# Patient Record
Sex: Male | Born: 1955 | Race: White | Hispanic: No | Marital: Married | Smoking: Never smoker
Health system: Southern US, Community
[De-identification: ages and names within clinical notes are randomized; demographics above are authoritative.]

## PROBLEM LIST (undated history)

## (undated) DIAGNOSIS — I10 Essential (primary) hypertension: Secondary | ICD-10-CM

## (undated) DIAGNOSIS — C801 Malignant (primary) neoplasm, unspecified: Secondary | ICD-10-CM

## (undated) DIAGNOSIS — E119 Type 2 diabetes mellitus without complications: Secondary | ICD-10-CM

## (undated) DIAGNOSIS — Z8639 Personal history of other endocrine, nutritional and metabolic disease: Secondary | ICD-10-CM

## (undated) HISTORY — PX: LIVER TRANSPLANT: SHX410

---

## 1999-10-19 ENCOUNTER — Encounter (INDEPENDENT_AMBULATORY_CARE_PROVIDER_SITE_OTHER): Payer: Self-pay | Admitting: Specialist

## 1999-10-19 ENCOUNTER — Ambulatory Visit (HOSPITAL_COMMUNITY): Admission: RE | Admit: 1999-10-19 | Discharge: 1999-10-19 | Payer: Self-pay | Admitting: Gastroenterology

## 2007-01-30 ENCOUNTER — Ambulatory Visit: Payer: Self-pay | Admitting: Hematology and Oncology

## 2007-02-15 LAB — CBC WITH DIFFERENTIAL/PLATELET
BASO%: 0.9 % (ref 0.0–2.0)
Basophils Absolute: 0 10*3/uL (ref 0.0–0.1)
EOS%: 2.8 % (ref 0.0–7.0)
HGB: 10.2 g/dL — ABNORMAL LOW (ref 13.0–17.1)
LYMPH%: 18.2 % (ref 14.0–48.0)
MCHC: 32.9 g/dL (ref 32.0–35.9)
MONO%: 12.1 % (ref 0.0–13.0)
NEUT#: 1.9 10*3/uL (ref 1.5–6.5)
NEUT%: 66 % (ref 40.0–75.0)
WBC: 2.9 10*3/uL — ABNORMAL LOW (ref 4.0–10.0)

## 2007-02-15 LAB — URINALYSIS, MICROSCOPIC - CHCC
Glucose: NEGATIVE g/dL
Ketones: NEGATIVE mg/dL
Leukocyte Esterase: NEGATIVE
Nitrite: NEGATIVE

## 2007-02-19 LAB — COMPREHENSIVE METABOLIC PANEL
ALT: 27 U/L (ref 0–53)
AST: 30 U/L (ref 0–37)
BUN: 18 mg/dL (ref 6–23)
CO2: 22 mEq/L (ref 19–32)
Calcium: 9 mg/dL (ref 8.4–10.5)
Creatinine, Ser: 0.9 mg/dL (ref 0.40–1.50)
Potassium: 4 mEq/L (ref 3.5–5.3)
Sodium: 141 mEq/L (ref 135–145)
Total Bilirubin: 0.8 mg/dL (ref 0.3–1.2)
Total Protein: 6.9 g/dL (ref 6.0–8.3)

## 2007-02-19 LAB — PROTEIN ELECTROPHORESIS, SERUM
Alpha-1-Globulin: 3.8 % (ref 2.9–4.9)
Beta Globulin: 7.6 % — ABNORMAL HIGH (ref 4.7–7.2)
Total Protein, Serum Electrophoresis: 6.9 g/dL (ref 6.0–8.3)

## 2007-02-19 LAB — DIRECT ANTIGLOBULIN TEST (NOT AT ARMC): DAT IgG: NEGATIVE

## 2007-02-19 LAB — VITAMIN B12: Vitamin B-12: 610 pg/mL (ref 211–911)

## 2007-02-19 LAB — FERRITIN: Ferritin: 5 ng/mL — ABNORMAL LOW (ref 22–322)

## 2007-02-19 LAB — ERYTHROPOIETIN: Erythropoietin: 83.8 m[IU]/mL — ABNORMAL HIGH (ref 2.6–34.0)

## 2007-02-19 LAB — HEPATITIS C ANTIBODY: HCV Ab: POSITIVE — AB

## 2007-02-19 LAB — LACTATE DEHYDROGENASE: LDH: 169 U/L (ref 94–250)

## 2007-03-08 ENCOUNTER — Ambulatory Visit (HOSPITAL_COMMUNITY): Admission: RE | Admit: 2007-03-08 | Discharge: 2007-03-08 | Payer: Self-pay | Admitting: Hematology and Oncology

## 2007-03-08 ENCOUNTER — Other Ambulatory Visit: Admission: RE | Admit: 2007-03-08 | Discharge: 2007-03-08 | Payer: Self-pay | Admitting: Oncology

## 2007-03-08 ENCOUNTER — Encounter: Payer: Self-pay | Admitting: Hematology and Oncology

## 2007-03-14 LAB — HEPATITIS B CORE ANTIBODY, IGM: Hep B C IgM: NEGATIVE

## 2007-03-14 LAB — HEPATITIS B CORE ANTIBODY, TOTAL: Hep B Core Total Ab: NEGATIVE

## 2007-03-14 LAB — HIV ANTIBODY (ROUTINE TESTING W REFLEX)

## 2007-03-14 LAB — HEPATITIS A ANTIBODY, IGM: Hep A IgM: NEGATIVE

## 2007-03-22 ENCOUNTER — Ambulatory Visit (HOSPITAL_COMMUNITY): Admission: RE | Admit: 2007-03-22 | Discharge: 2007-03-22 | Payer: Self-pay | Admitting: Gastroenterology

## 2007-03-22 ENCOUNTER — Encounter (INDEPENDENT_AMBULATORY_CARE_PROVIDER_SITE_OTHER): Payer: Self-pay | Admitting: Gastroenterology

## 2007-04-03 ENCOUNTER — Ambulatory Visit: Payer: Self-pay | Admitting: Hematology and Oncology

## 2007-04-05 LAB — IRON AND TIBC
%SAT: 31 % (ref 20–55)
Iron: 98 ug/dL (ref 42–165)
TIBC: 321 ug/dL (ref 215–435)
UIBC: 223 ug/dL

## 2007-04-05 LAB — CBC WITH DIFFERENTIAL/PLATELET
BASO%: 0.4 % (ref 0.0–2.0)
Eosinophils Absolute: 0.1 10*3/uL (ref 0.0–0.5)
MCHC: 34.1 g/dL (ref 32.0–35.9)
MONO#: 0.3 10*3/uL (ref 0.1–0.9)
NEUT#: 2.6 10*3/uL (ref 1.5–6.5)
RBC: 5.13 10*6/uL (ref 4.20–5.71)
RDW: 27.5 % — ABNORMAL HIGH (ref 11.2–14.6)
WBC: 3.5 10*3/uL — ABNORMAL LOW (ref 4.0–10.0)
lymph#: 0.4 10*3/uL — ABNORMAL LOW (ref 0.9–3.3)

## 2007-04-05 LAB — BASIC METABOLIC PANEL
BUN: 15 mg/dL (ref 6–23)
Calcium: 8.9 mg/dL (ref 8.4–10.5)
Chloride: 108 mEq/L (ref 96–112)
Creatinine, Ser: 0.97 mg/dL (ref 0.40–1.50)

## 2007-04-05 LAB — FERRITIN: Ferritin: 269 ng/mL (ref 22–322)

## 2008-01-31 ENCOUNTER — Encounter: Admission: RE | Admit: 2008-01-31 | Discharge: 2008-01-31 | Payer: Self-pay | Admitting: Gastroenterology

## 2008-02-05 IMAGING — CT CT PELVIS W/ CM
2 of 6 series · 17 of 46 positions shown, 19 images · IV contrast (agent unspecified)
Comparison: None

CT Abdomen with contrast

Clinical data 51-year-old male with history of hepatitis C.

Exam: CT abdomen and pelvis with contrast:
Contiguous axial CT images were obtained from the lung bases through the pubic
symphysis following administration of IV and oral contrast. 
100 mls omni-877 was administered intravenously.

[Series 3: abd_pel 5.0 b40s · axial · 0.89mm/px · z∈[-648,-233]mm · 14 of 97 slices shown, 16 images]
[im 7/97  soft-tissue]
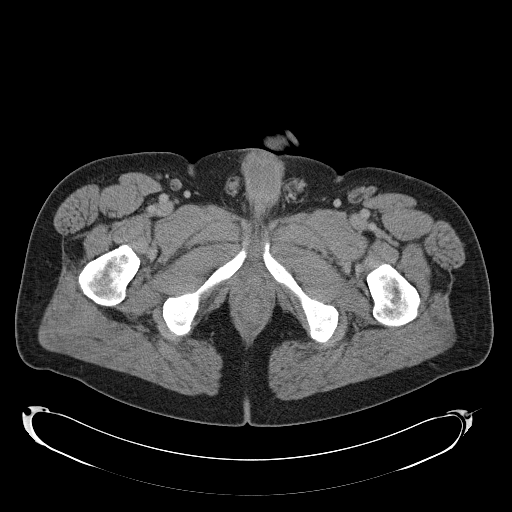
[im 7/97  bone]
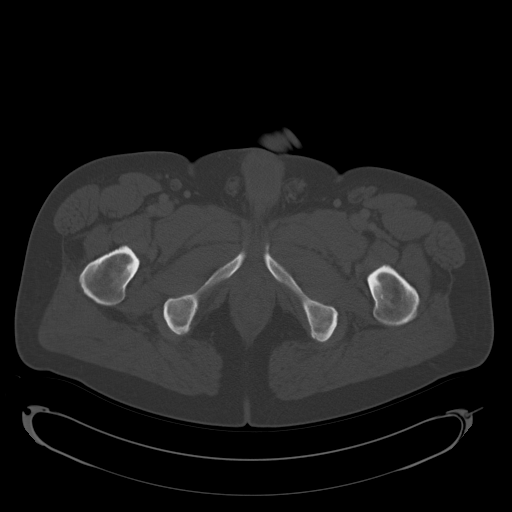
[im 13/97  soft-tissue]
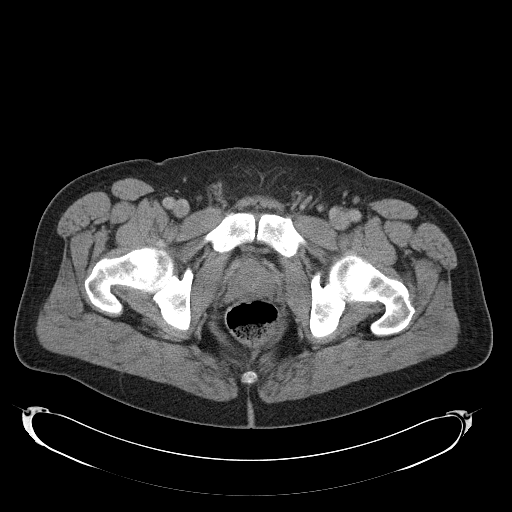
[im 20/97  soft-tissue]
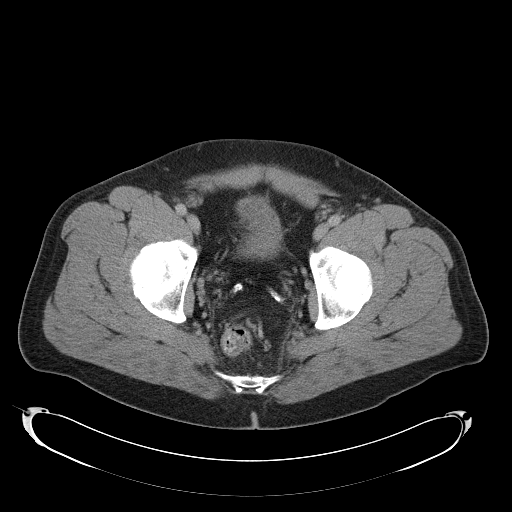
[im 26/97  soft-tissue]
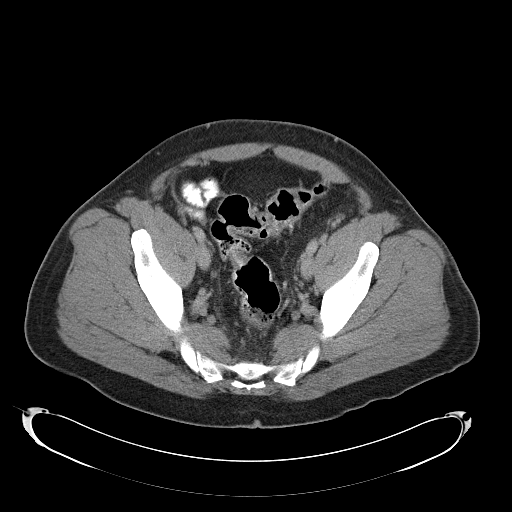
[im 33/97  soft-tissue]
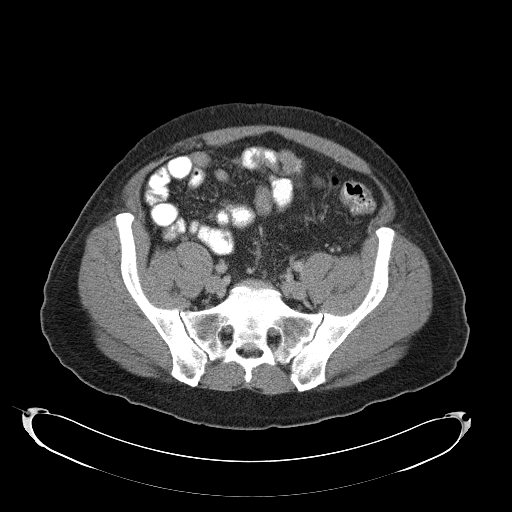
[im 39/97  soft-tissue]
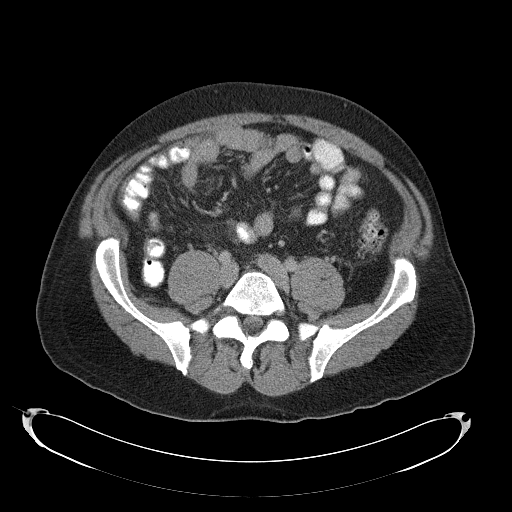
[im 45/97  soft-tissue]
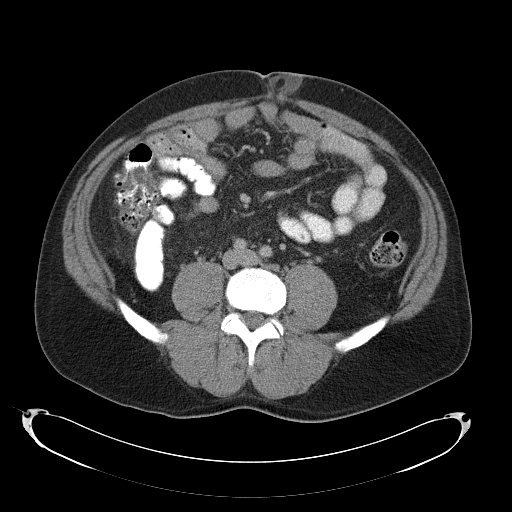
[im 52/97  soft-tissue]
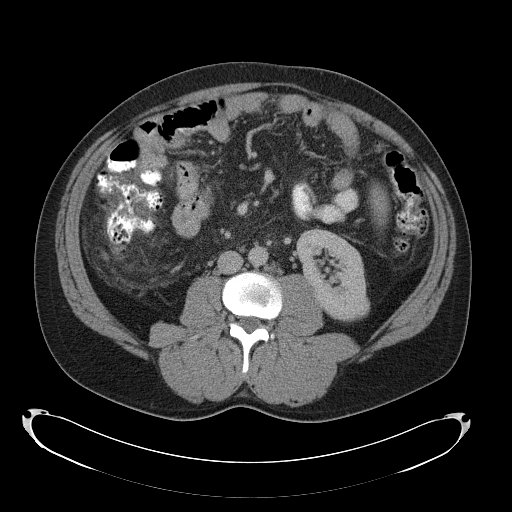
[im 58/97  soft-tissue]
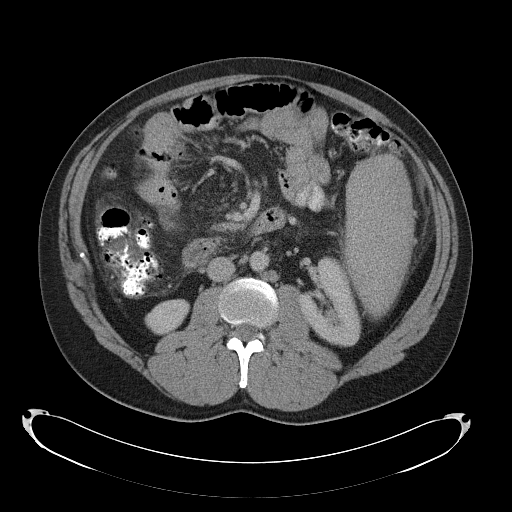
[im 58/97  bone]
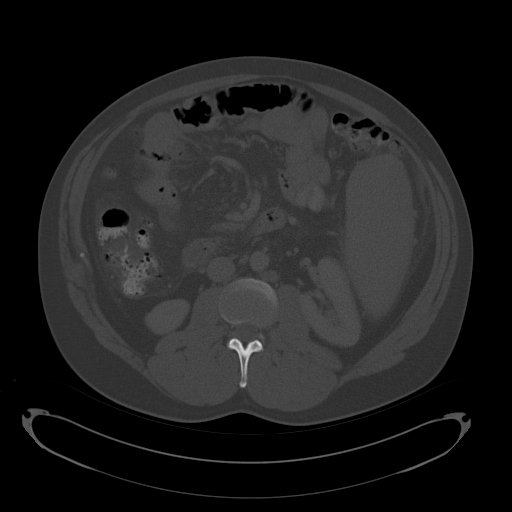
[im 65/97  soft-tissue]
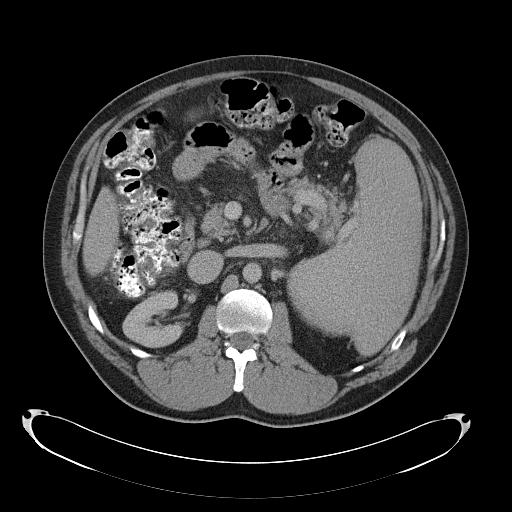
[im 71/97  soft-tissue]
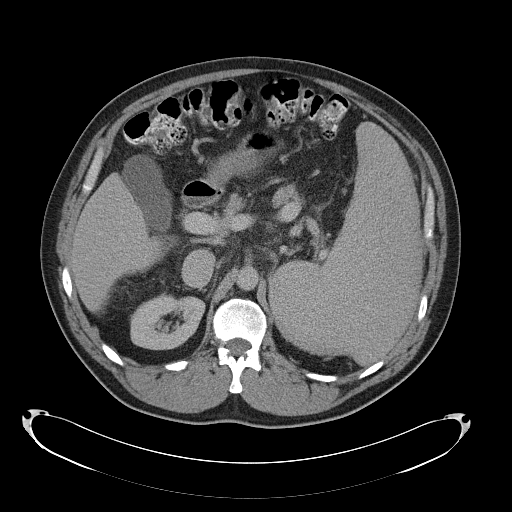
[im 77/97  soft-tissue]
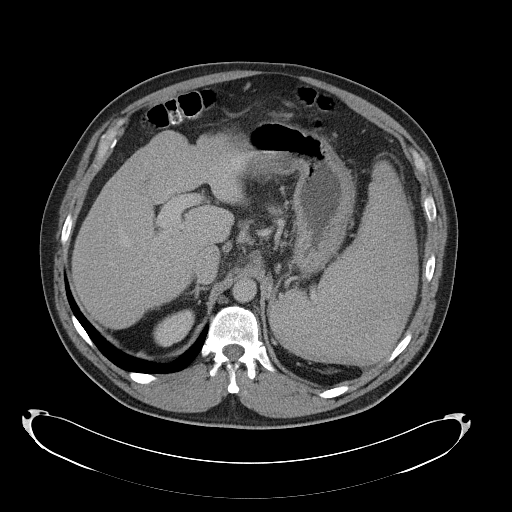
[im 84/97  soft-tissue]
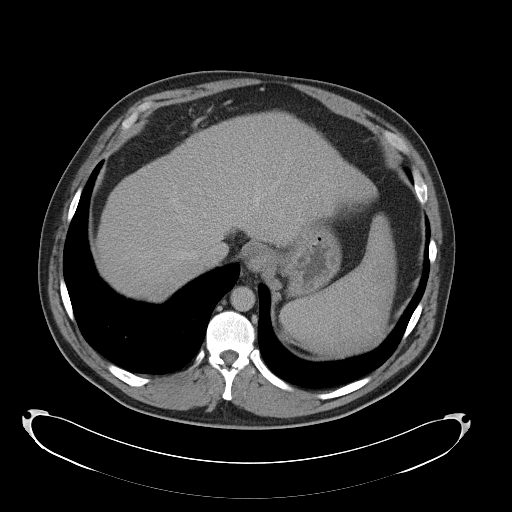
[im 90/97  soft-tissue]
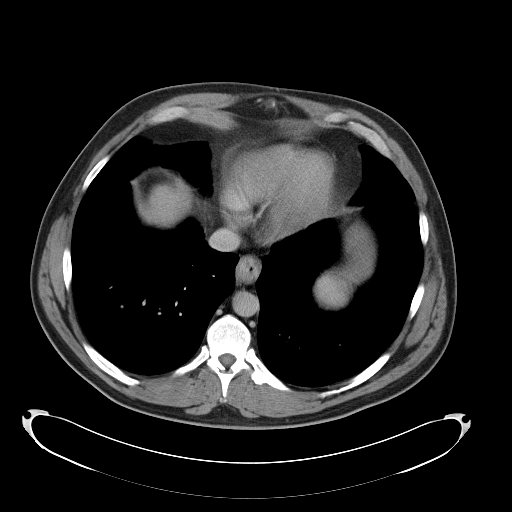

[Series 602: <mpr thick range> · coronal · 0.94mm/px · 3 of 106 slices shown]
[im 36/106  soft-tissue]
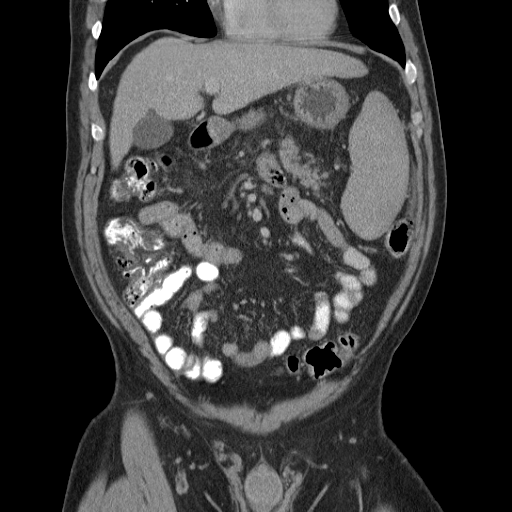
[im 47/106  soft-tissue]
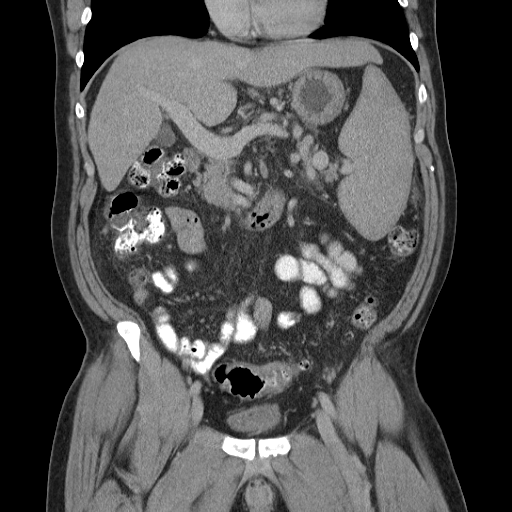
[im 59/106  soft-tissue]
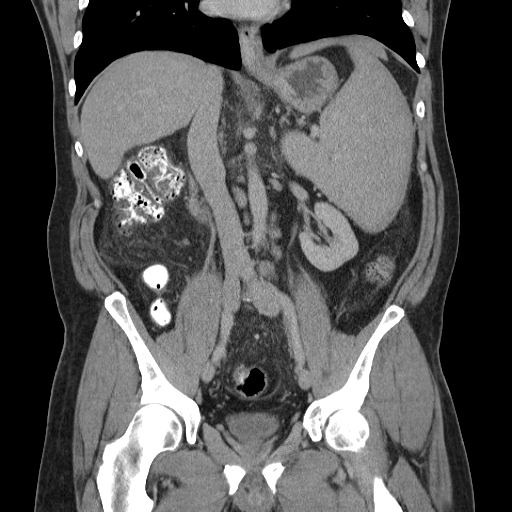

[17 of 46 positions shown; findings below may reference images not displayed]

FINDINGS: Lung bases are clear and heart is normal. The liver appears mildly shrunken with
a  lobular contour. Within the superior posterior aspect of the right hepatic
lobe (segment 7) there is a 35 x 34 mm hypovascular lesion. There is partial
recanalization of the umbilical vein and  gastric venous collaterals.  The
portal vein is patent. The spleen is markedly enlarged. There are several small
periportal lymph nodes; for example 12 mm node image 26 and 7 mm node on image
25. Left periaortic lymph node measures 21 mm on image 45.  The gallbladder and
pancreas appear normal. The adrenal and kidneys appear normal. There is a mild
pericecal fat stranding. The appendix appears normal.
IMPRESSION: 1. Hypovascular 3 cm mass within the right hepatic lobe is concerning for
hepatocellular carcinoma. Recommend dedicated hepatic CT or MRI for further
characterization.
2. Mild periportal and periaortic lymphadenopathy is nonspecific finding and
often associated with cirrhosis.
3. Shrunken lobular liver with splenomegaly  and venous collaterals consistent
with portal hypertension of cirrhosis.
4. Pericecal fat stranding without evidence of appendicitis. This may represent
focal colitis versus potential mild ascites from portal hypertension.

CT Pelvis with contrast:
FINDINGS: No evidence of pelvic lymphadenopathy. The bladder, seminal vesicles,
prostate and rectum appear normal. Scattered sigmoid diverticula.
IMPRESSION: 1. No acute findings in the pelvis.

See abdominal CT findings above:

## 2008-05-17 ENCOUNTER — Emergency Department (HOSPITAL_COMMUNITY): Admission: EM | Admit: 2008-05-17 | Discharge: 2008-05-17 | Payer: Self-pay | Admitting: Emergency Medicine

## 2010-08-01 ENCOUNTER — Encounter: Payer: Self-pay | Admitting: Hematology and Oncology

## 2010-11-22 NOTE — Consult Note (Signed)
[name], [name] NO.:  [id number]   MEDICAL RECORD NO.:  [id number]          PATIENT TYPE:  EMS   LOCATION:  [facility]                         FACILITY:  [facility]   PHYSICIAN:  Lennie Muckle, MD      DATE OF BIRTH:  [birth year]   DATE OF CONSULTATION:  05/17/2008  DATE OF DISCHARGE:                                 CONSULTATION   CHIEF COMPLAINT:  Abdominal pain.   HISTORY OF PRESENT ILLNESS:  [name] is a 55 year old male with a  history of hepatitis C and liver failure who had onset of abdominal pain  approximately 6 o'clock yesterday, was at 10/10 and its worse.  He did  have generalized abdominal pain.  He has increased swelling in his  umbilical region.  This has been present for greater than 5 years.  This  has been reducible in the past.  He has not had any skin discoloration  in his umbilical region prior.  He has had some nausea, but no emesis.  He had not had any flatus in the past 24 hours and the last bowel  movement was yesterday.   PAST MEDICAL HISTORY:  Hepatitis C.   FAMILY HISTORY:  Noncontributory.   SOCIAL HISTORY:  No tobacco or alcohol use.   ALLERGIES:  No drug allergies.   MEDICATIONS:  Nadolol 40 mg p.o. nightly.   PAST SURGICAL HISTORY:  He has had a RFA of the liver lesion in [place].   REVIEW OF SYSTEMS:  He has a history of esophageal varices.   PHYSICAL EXAMINATION:  GENERAL:  He is lying in stretcher in no acute  distress.  VITAL SIGNS:  Temperature is 97.1, pulse is 64, and blood pressure  129/75.  He is an overweight man.  HEENT:  Extraocular muscles are intact.  Sclerae are slightly reddened.  Questionable scleral icterus. Angiomas noted on face.  CHEST:  Clear to auscultation bilaterally.  CARDIOVASCULAR:  Regular rate and rhythm.  ABDOMEN:  Obese.  There is an incision on the right upper quadrant.  There is a noticable umbilical hernia with some discoloration of the  skin.  With some pressure, the hernia is able to be  reduced and  approximately 3 cm-4cm defect is noted.  EXTREMITIES:  Edema  is noted in the bilateral lower extremity.  SKIN:  He does have skin changes in bilateral extremity.  No jaundice is  noted.  NEUROLOGIC:  Normal.   IMAGING:  KUB, he does have dilated small-bowel loop on his x-ray.   LABORATORY DATA:  White count is normal at 6.7, hemoglobin and  hematocrit 15.9 and 46, and platelets 55.  His liver enzymes; AST is 52,  ALT is 39, alkaline phosphatase is 60, bilirubin is 2.6, and sodium 138.   ASSESSMENT AND PLAN:  Reducible umbilical hernia.  I discussed with Mr.  France and his wife who is present in the room that since it was  reducible we could monitor for a couple of hours in the emergency  department to ensure his pain is under control and he is able to keep  liquids  down.  Since he is on the transplant list in [place], I would  advise having followup there.  I instructed the patient how to reduce  the hernia in the future, and will be available if any other problems  arise.      Lennie Muckle, MD  Electronically Signed     ALA/MEDQ  D:  05/17/2008  T:  05/17/2008  Job:  [id number]

## 2011-04-11 LAB — DIFFERENTIAL
Basophils Relative: 0
Eosinophils Absolute: 0
Eosinophils Relative: 1
Lymphs Abs: 0.5 — ABNORMAL LOW

## 2011-04-11 LAB — COMPREHENSIVE METABOLIC PANEL
ALT: 39
AST: 52 — ABNORMAL HIGH
Alkaline Phosphatase: 62
CO2: 24
Calcium: 9.2
Chloride: 107
GFR calc Af Amer: >60
GFR calc non Af Amer: >60
Potassium: 4.4
Sodium: 138

## 2011-04-11 LAB — URINALYSIS, ROUTINE W REFLEX MICROSCOPIC
Nitrite: NEGATIVE
Specific Gravity, Urine: 1.031 — ABNORMAL HIGH
Urobilinogen, UA: 0.2
pH: 5

## 2011-04-11 LAB — CBC
Hemoglobin: 15.9
MCHC: 34.2
RBC: 5.1
WBC: 6.7

## 2011-04-11 LAB — LIPASE, BLOOD: Lipase: 26

## 2011-04-21 LAB — CBC
HCT: 33.7 — ABNORMAL LOW
MCV: 71.7 — ABNORMAL LOW
Platelets: 92 — ABNORMAL LOW
RDW: 21.9 — ABNORMAL HIGH

## 2011-04-21 LAB — BONE MARROW EXAM

## 2020-06-08 ENCOUNTER — Other Ambulatory Visit: Payer: Self-pay | Admitting: Legal Medicine

## 2022-12-01 ENCOUNTER — Encounter (HOSPITAL_BASED_OUTPATIENT_CLINIC_OR_DEPARTMENT_OTHER): Payer: Self-pay

## 2022-12-01 ENCOUNTER — Other Ambulatory Visit: Payer: Self-pay

## 2022-12-01 ENCOUNTER — Emergency Department (HOSPITAL_BASED_OUTPATIENT_CLINIC_OR_DEPARTMENT_OTHER): Payer: No Typology Code available for payment source

## 2022-12-01 ENCOUNTER — Emergency Department (HOSPITAL_BASED_OUTPATIENT_CLINIC_OR_DEPARTMENT_OTHER)
Admission: EM | Admit: 2022-12-01 | Discharge: 2022-12-01 | Disposition: A | Discharge status: Home / Self Care | Payer: No Typology Code available for payment source | Attending: Emergency Medicine | Admitting: Emergency Medicine

## 2022-12-01 DIAGNOSIS — R002 Palpitations: Secondary | ICD-10-CM | POA: Diagnosis present

## 2022-12-01 DIAGNOSIS — E119 Type 2 diabetes mellitus without complications: Secondary | ICD-10-CM | POA: Diagnosis not present

## 2022-12-01 DIAGNOSIS — I4891 Unspecified atrial fibrillation: Secondary | ICD-10-CM | POA: Insufficient documentation

## 2022-12-01 DIAGNOSIS — Z7901 Long term (current) use of anticoagulants: Secondary | ICD-10-CM | POA: Diagnosis not present

## 2022-12-01 DIAGNOSIS — D751 Secondary polycythemia: Secondary | ICD-10-CM | POA: Insufficient documentation

## 2022-12-01 DIAGNOSIS — D696 Thrombocytopenia, unspecified: Secondary | ICD-10-CM | POA: Insufficient documentation

## 2022-12-01 DIAGNOSIS — Z7982 Long term (current) use of aspirin: Secondary | ICD-10-CM | POA: Diagnosis not present

## 2022-12-01 HISTORY — DX: Essential (primary) hypertension: I10

## 2022-12-01 HISTORY — DX: Type 2 diabetes mellitus without complications: E11.9

## 2022-12-01 HISTORY — DX: Personal history of other endocrine, nutritional and metabolic disease: Z86.39

## 2022-12-01 HISTORY — DX: Malignant (primary) neoplasm, unspecified: C80.1

## 2022-12-01 LAB — COMPREHENSIVE METABOLIC PANEL
ALT: 15 U/L (ref 0–44)
AST: 20 U/L (ref 15–41)
Albumin: 4.6 g/dL (ref 3.5–5.0)
Alkaline Phosphatase: 50 U/L (ref 38–126)
Anion gap: 11 (ref 5–15)
BUN: 29 mg/dL — ABNORMAL HIGH (ref 8–23)
CO2: 21 mmol/L — ABNORMAL LOW (ref 22–32)
Calcium: 9.1 mg/dL (ref 8.9–10.3)
Chloride: 105 mmol/L (ref 98–111)
Creatinine, Ser: 1.67 mg/dL — ABNORMAL HIGH (ref 0.61–1.24)
GFR, Estimated: 45 mL/min — ABNORMAL LOW (ref >60)
Glucose, Bld: 131 mg/dL — ABNORMAL HIGH (ref 70–99)
Potassium: 3.5 mmol/L (ref 3.5–5.1)
Sodium: 137 mmol/L (ref 135–145)
Total Bilirubin: 1.5 mg/dL — ABNORMAL HIGH (ref 0.3–1.2)
Total Protein: 7.1 g/dL (ref 6.5–8.1)

## 2022-12-01 LAB — CBC WITH DIFFERENTIAL/PLATELET
Abs Immature Granulocytes: 0.02 10*3/uL (ref 0.00–0.07)
Basophils Absolute: 0.1 10*3/uL (ref 0.0–0.1)
Basophils Relative: 1 %
Eosinophils Absolute: 0.2 10*3/uL (ref 0.0–0.5)
Eosinophils Relative: 2 %
HCT: 51 % (ref 39.0–52.0)
Hemoglobin: 18.1 g/dL — ABNORMAL HIGH (ref 13.0–17.0)
Immature Granulocytes: 0 %
Lymphocytes Relative: 27 %
Lymphs Abs: 2.4 10*3/uL (ref 0.7–4.0)
MCH: 30.2 pg (ref 26.0–34.0)
MCHC: 35.5 g/dL (ref 30.0–36.0)
MCV: 85.1 fL (ref 80.0–100.0)
Monocytes Absolute: 0.9 10*3/uL (ref 0.1–1.0)
Monocytes Relative: 10 %
Neutro Abs: 5.4 10*3/uL (ref 1.7–7.7)
Neutrophils Relative %: 60 %
Platelets: 141 10*3/uL — ABNORMAL LOW (ref 150–400)
RBC: 5.99 MIL/uL — ABNORMAL HIGH (ref 4.22–5.81)
RDW: 12.2 % (ref 11.5–15.5)
WBC: 9 10*3/uL (ref 4.0–10.5)
nRBC: 0 % (ref 0.0–0.2)

## 2022-12-01 LAB — CBG MONITORING, ED: Glucose-Capillary: 119 mg/dL — ABNORMAL HIGH (ref 70–99)

## 2022-12-01 LAB — PROTIME-INR
INR: 1 (ref 0.8–1.2)
Prothrombin Time: 13.7 seconds (ref 11.4–15.2)

## 2022-12-01 LAB — TROPONIN I (HIGH SENSITIVITY)
Troponin I (High Sensitivity): 7 ng/L (ref <18)
Troponin I (High Sensitivity): 8 ng/L (ref <18)

## 2022-12-01 MED ORDER — APIXABAN 5 MG PO TABS: 5.0000 mg | ORAL_TABLET | Freq: Two times a day (BID) | ORAL | 0 refills | Status: AC

## 2022-12-01 MED ORDER — METOPROLOL SUCCINATE ER 25 MG PO TB24: 25.0000 mg | ORAL_TABLET | Freq: Every day | ORAL | 0 refills | Status: AC

## 2022-12-01 NOTE — ED Triage Notes (Signed)
Palpitations, chest pain, SOB x 4 hours, patient has had similar episodes a few months ago.

## 2022-12-01 NOTE — ED Provider Notes (Signed)
[place] EMERGENCY DEPARTMENT AT [facility]  Provider Note  CSN: [id number] Arrival date & time: 12/01/22 8295  History Chief Complaint  Patient presents with   Chest Pain    [name] is a 67 y.o. 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 Has had similar before but typically self-limited, no prior heart disease as far as he is aware.    Home Medications Prior to Admission medications   Medication Sig Start Date End Date Taking? Authorizing Provider  apixaban (ELIQUIS) 5 MG TABS tablet Take 1 tablet (5 mg total) by mouth 2 (two) times daily. 12/01/22 12/31/22 Yes Pollyann Savoy, MD  aspirin 325 MG tablet Take 325 mg by mouth daily.   Yes [provider]  metoprolol succinate (TOPROL XL) 25 MG 24 hr tablet Take 1 tablet (25 mg total) by mouth daily. 12/01/22  Yes Pollyann Savoy, MD  tacrolimus (PROGRAF) 0.5 MG capsule Take 0.5 mg by mouth 2 (two) times daily.   Yes [provider]     Allergies    Patient has no known allergies.   Review of Systems   Review of Systems Please see HPI for pertinent positives and negatives  Physical Exam BP (!) 149/89   Pulse 78   Temp 97.6 F (36.4 C) (Oral)   Resp 14   Ht 5\' 11"  (1.803 m)   Wt 90.7 kg   SpO2 97%   BMI 27.89 kg/m   Physical Exam Vitals and nursing note reviewed.  Constitutional:      Appearance: Normal appearance.  HENT:     Head: Normocephalic and atraumatic.     Nose: Nose normal.     Mouth/Throat:     Mouth: Mucous membranes are moist.  Eyes:     Extraocular Movements: Extraocular movements intact.     Conjunctiva/sclera: Conjunctivae normal.  Cardiovascular:     Rate and Rhythm: Tachycardia present. Rhythm irregular.  Pulmonary:     Effort: Pulmonary effort is normal.     Breath sounds: Normal breath sounds.   Abdominal:     General: Abdomen is flat.     Palpations: Abdomen is soft.     Tenderness: There is no abdominal tenderness.  Musculoskeletal:        General: No swelling. Normal range of motion.     Cervical back: Neck supple.  Skin:    General: Skin is warm and dry.  Neurological:     General: No focal deficit present.     Mental Status: He is alert.  Psychiatric:        Mood and Affect: Mood normal.     ED Results / Procedures / Treatments   EKG EKG Interpretation  Date/Time:  Friday Dec 01 2022 03:47:36 EDT Ventricular Rate:  88 PR Interval:  169 QRS Duration: 85 QT Interval:  347 QTC Calculation: 420 R Axis:   43 Text Interpretation: Sinus rhythm Anteroseptal infarct, old Since last tracing Sinus rhythm has replaced Atrial fibrillation Confirmed by Susy Frizzle [phone]) on 12/01/2022 4:18:13 AM  Procedures Procedures  Medications Ordered in the ED Medications - No data to display  Initial Impression and Plan  Patient arrives with symptomatic afib with RVR, onset several hours PTA, converted to sinus rhythm during my evaluation. Will check labs and CXR, CMP and INR ordered due to prior liver failure/transplant. Continue  to monitor for recurrence.   ED Course   Clinical Course as of 12/01/22 0645  Fri Dec 01, 2022  0350 CBC with polycythemia and mild thrombocytopenia, otherwise normal.  [CS]  0355 Patient reports he is feeling better after spontaneous conversion to NSR.  [CS]  0356 INR is normal.  [CS]  0410 CMP shows mildly elevated Cr, no recent to compare. Tbili is also mildly elevated. Otherwise unremarkable CMP.  [CS]  0415 Trop #1 is normal.  [CS]  0430 Spoke with Dr. Geraldo Pitter, Cardiology, who agrees with plan to monitor for second trop. If remains rate controlled, will anticipate discharge with Rx for metoprolol and Eliquis with close outpatient Cards follow up.  [CS]  0622 I personally viewed the images from radiology studies and agree with radiologist  interpretation: CXR is clear [CS]  0641 Repeat Trop remains normal. Patient has not had any further afib episodes in the ED. Plan discharge as above plan. [facility] [place], will refer to Cardiology as a backup resource for him.  [CS]    Clinical Course User Index [CS] Pollyann Savoy, MD     MDM Rules/Calculators/A&P Medical Decision Making Given presenting complaint, I considered that admission might be necessary. After review of results from ED lab and/or imaging studies, admission to the hospital is not indicated at this time.    Problems Addressed: Atrial fibrillation with rapid ventricular response (HCC): acute illness or injury  Amount and/or Complexity of Data Reviewed Labs: ordered. Decision-making details documented in ED Course. Radiology: ordered and independent interpretation performed. Decision-making details documented in ED Course. ECG/medicine tests: ordered and independent interpretation performed. Decision-making details documented in ED Course.  Risk Prescription drug management. Decision regarding hospitalization.     Final Clinical Impression(s) / ED Diagnoses Final diagnoses:  Atrial fibrillation with rapid ventricular response (HCC)    Rx / DC Orders ED Discharge Orders          Ordered    metoprolol succinate (TOPROL XL) 25 MG 24 hr tablet  Daily        12/01/22 0643    apixaban (ELIQUIS) 5 MG TABS tablet  2 times daily        12/01/22 1610    Ambulatory referral to Cardiology       Comments: If you have not heard from the Cardiology office within the next 72 hours please call [phone].   12/01/22 1914             Pollyann Savoy, MD 12/01/22 [phone]

## 2022-12-01 NOTE — Discharge Instructions (Addendum)
Please begin taking Toprol 25mg  daily to control your heart rate and Eliquis 5mg  twice a day to prevent blood clots. Follow up with Cardiology as soon as possible.

## 2022-12-01 NOTE — ED Notes (Signed)
Pt. Resting with NAD, denies c/p and SOB at present.

## 2022-12-01 NOTE — ED Notes (Signed)
Pt. Converted spontaneously to NSR, denies any chest pain at present.
